# Patient Record
Sex: Female | Born: 1995 | Race: Black or African American | Hispanic: No | Marital: Single | Smoking: Never smoker
Health system: Southern US, Community
[De-identification: ages and names within clinical notes are randomized; demographics above are authoritative.]

---
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
## 2017-06-20 IMAGING — CR DG HAND 2V*R*
2 series · 2 of 2 positions shown · non-contrast
Comparison: Right index finger radiographs performed earlier today
at [DATE] p.m.

CLINICAL DATA: Status post reduction of right index finger
dislocation. Initial encounter.

EXAM:
RIGHT HAND - 2 VIEW

[x hand pa right]
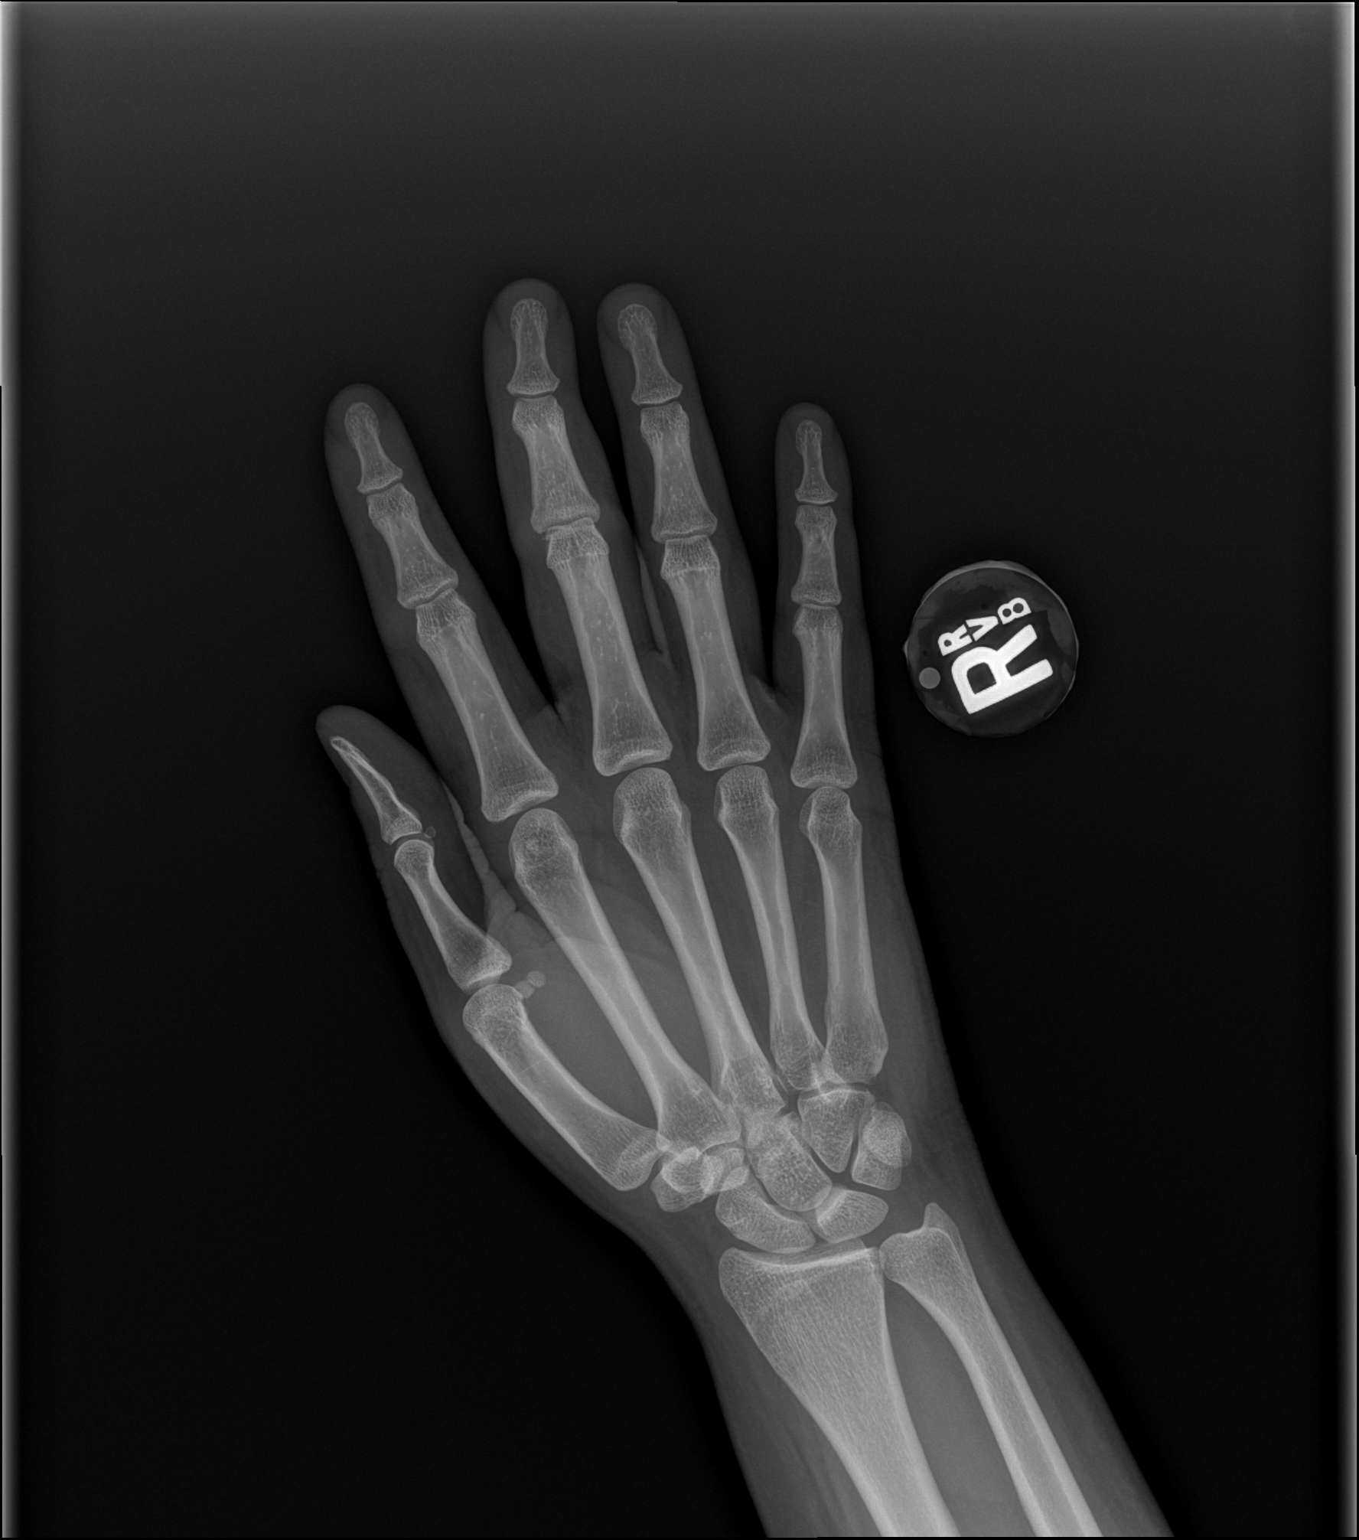

[x hand lat right]
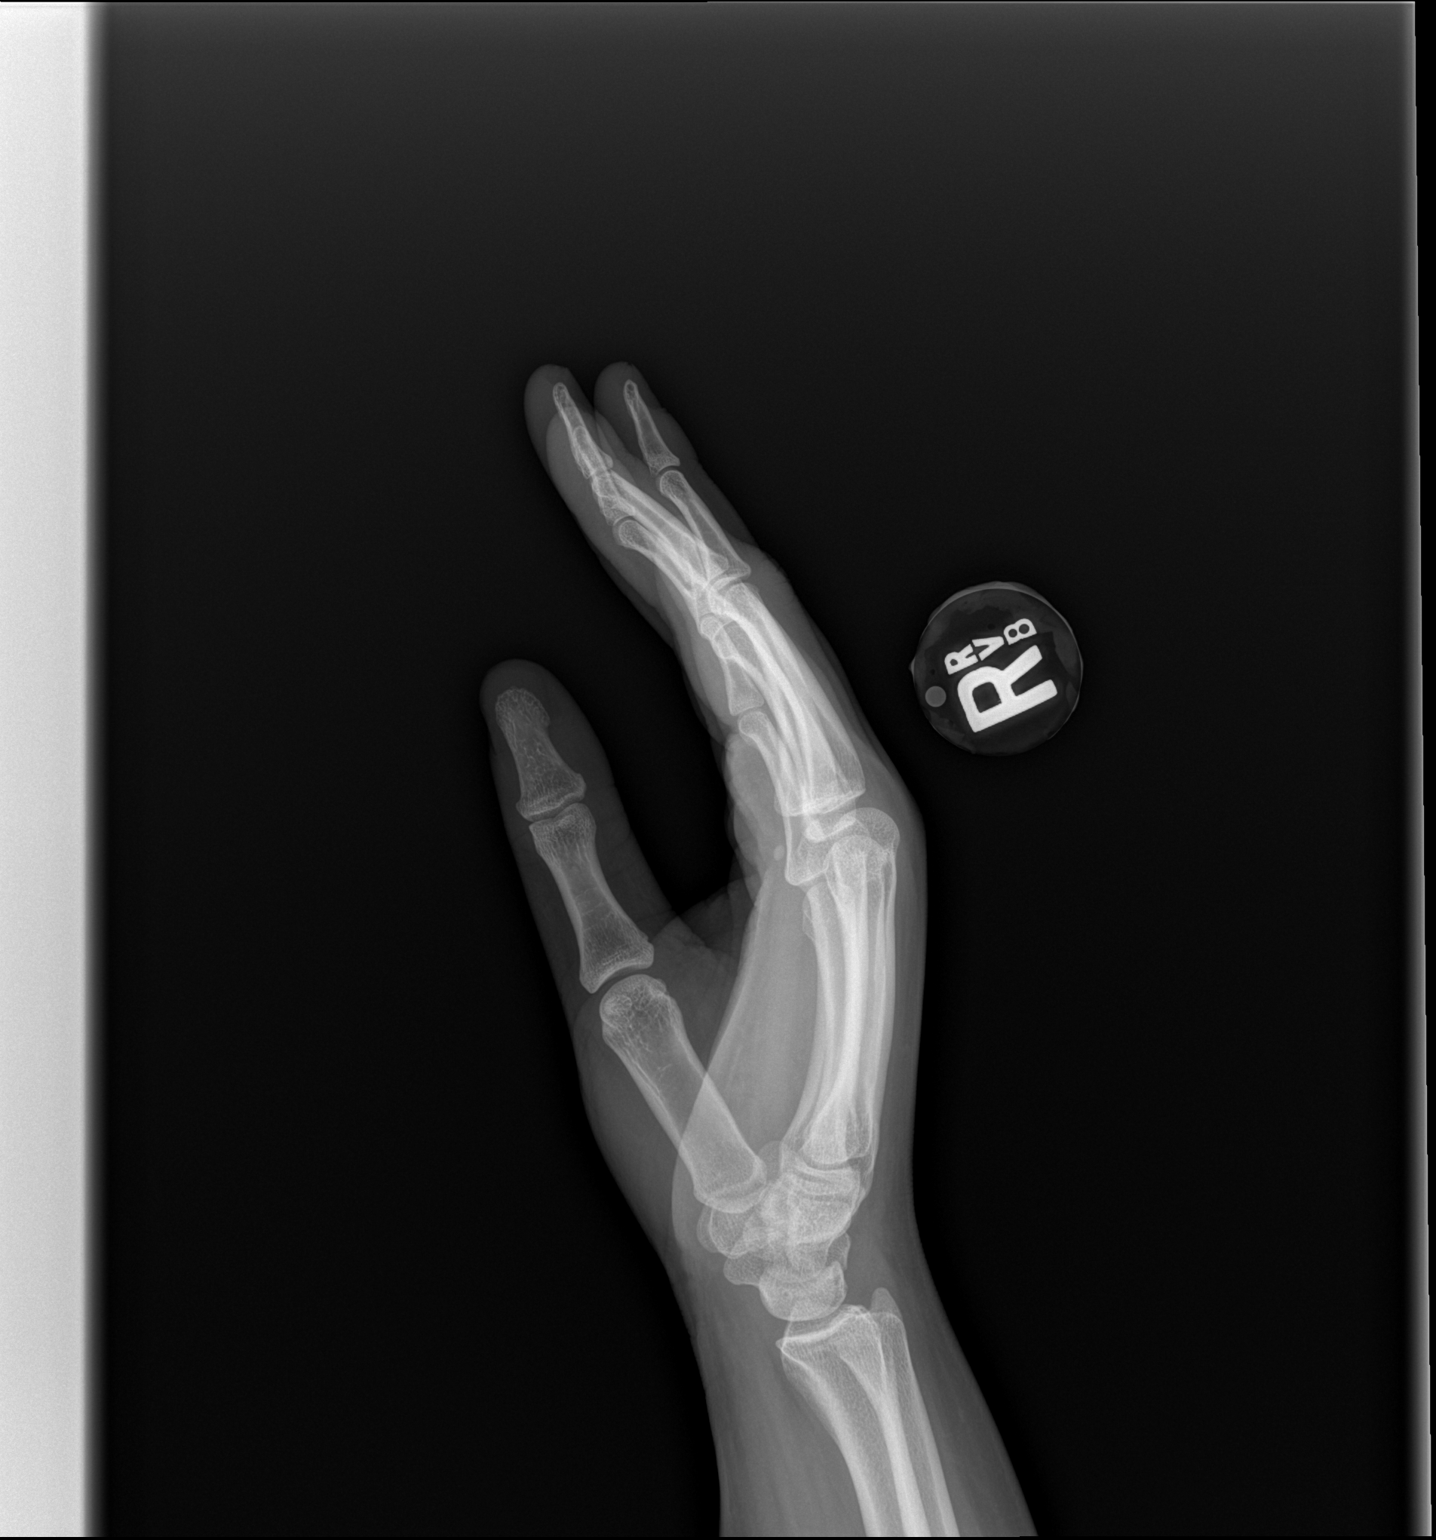

[2 of 2 positions shown; findings below may reference images not displayed]

FINDINGS: There has been successful reduction of the patient's second proximal
interphalangeal joint dislocation. No definite fracture is seen. The
carpal rows appear grossly intact, and demonstrate normal alignment.
IMPRESSION: Successful reduction of second proximal interphalangeal joint
dislocation. No fracture seen.

## 2017-06-20 IMAGING — CR DG FINGER INDEX 2+V*R*
3 series · 3 of 3 positions shown · non-contrast
Comparison: None.

CLINICAL DATA: Tripped and injured right index finger while running
up stairs. Initial encounter.

EXAM:
RIGHT INDEX FINGER 2+V

[x finger pa right]
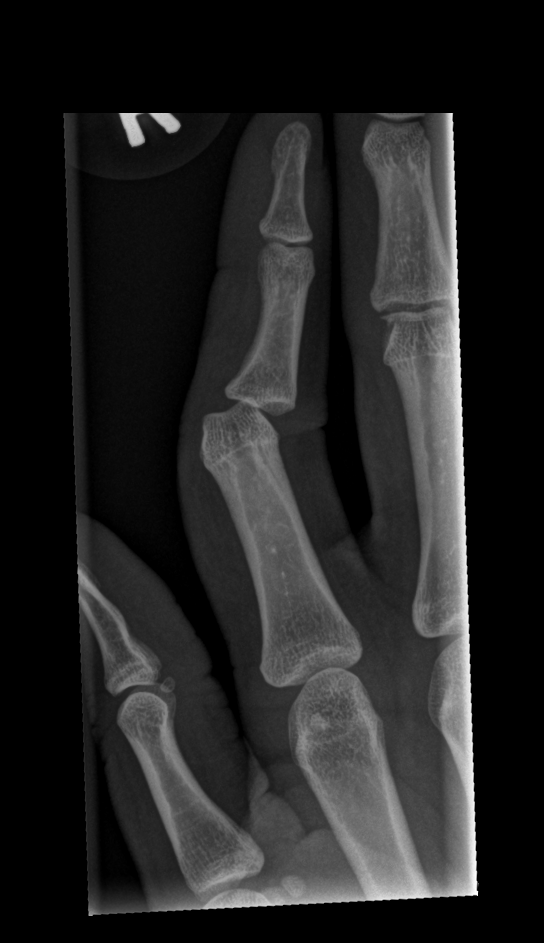

[x finger obl right]
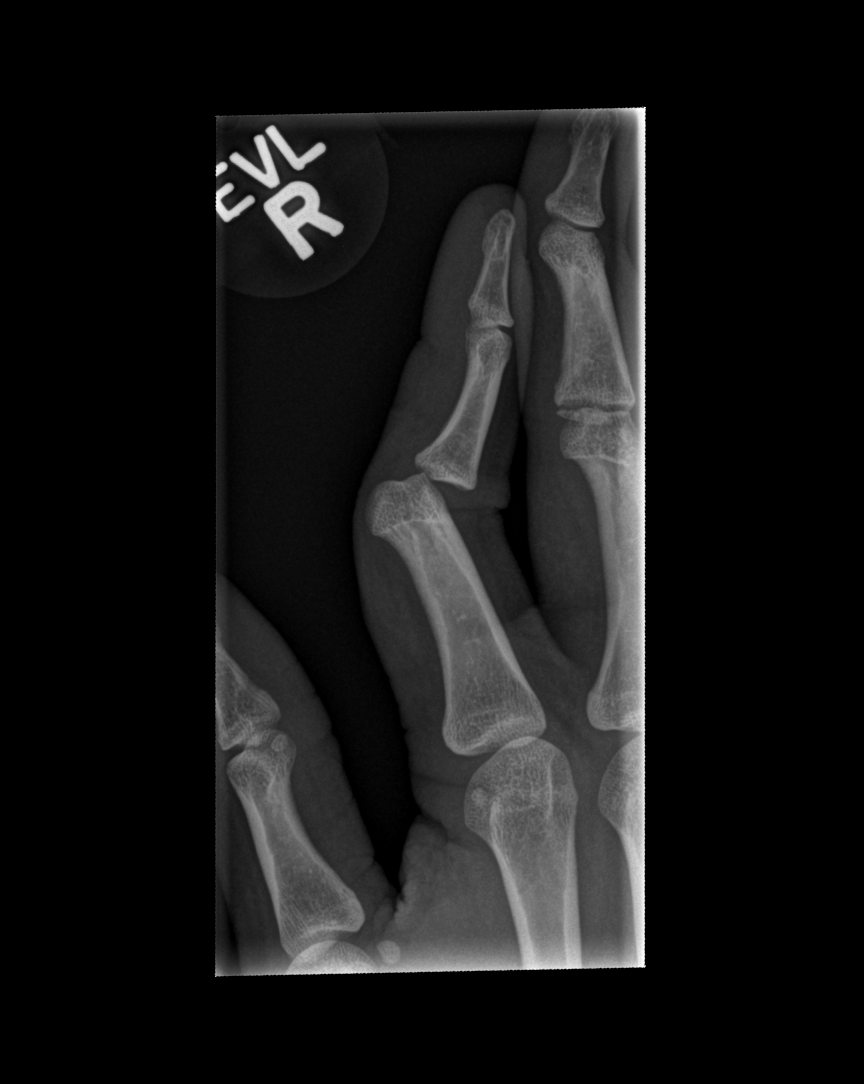

[x finger lat right]
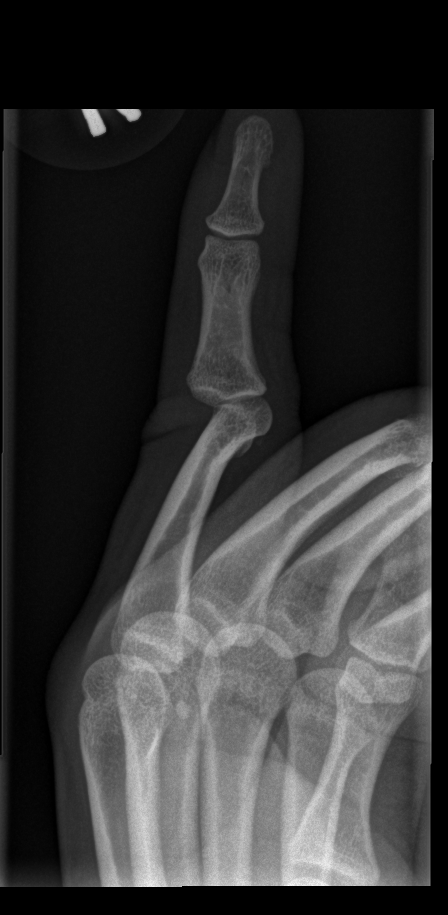

[3 of 3 positions shown; findings below may reference images not displayed]

FINDINGS: There is ulnar and dorsal dislocation of the second proximal
interphalangeal joint, with mild associated rotation. No definite
fracture is seen. Surrounding soft tissue swelling is noted.
IMPRESSION: Ulnar and dorsal dislocation of the second proximal interphalangeal
joint, with mild associated rotation. The base of the middle phalanx
is lodged against the dorsal aspect of the proximal phalanx.

## 2020-11-28 ENCOUNTER — Other Ambulatory Visit: Payer: Self-pay

## 2020-11-28 ENCOUNTER — Ambulatory Visit
Admission: RE | Admit: 2020-11-28 | Discharge: 2020-11-28 | Disposition: A | Discharge status: Home / Self Care | Payer: BC Managed Care – PPO | Source: Ambulatory Visit | Attending: Urgent Care | Admitting: Urgent Care

## 2020-11-28 VITALS — BP 129/89 | Temp 98.1°F | Resp 17

## 2020-11-28 DIAGNOSIS — R1030 Lower abdominal pain, unspecified: Secondary | ICD-10-CM

## 2020-11-28 DIAGNOSIS — Z7251 High risk heterosexual behavior: Secondary | ICD-10-CM

## 2020-11-28 DIAGNOSIS — N946 Dysmenorrhea, unspecified: Secondary | ICD-10-CM | POA: Insufficient documentation

## 2020-11-28 LAB — POCT URINALYSIS DIP (MANUAL ENTRY)
Bilirubin, UA: NEGATIVE
Blood, UA: NEGATIVE
Glucose, UA: NEGATIVE mg/dL
Nitrite, UA: NEGATIVE
Protein Ur, POC: NEGATIVE mg/dL
Spec Grav, UA: 1.025 (ref 1.010–1.025)
Urobilinogen, UA: 0.2 E.U./dL
pH, UA: 8.5 — AB (ref 5.0–8.0)

## 2020-11-28 LAB — POCT URINE PREGNANCY: Preg Test, Ur: NEGATIVE

## 2020-11-28 MED ORDER — NAPROXEN 500 MG PO TABS: 500.0000 mg | ORAL_TABLET | Freq: Two times a day (BID) | ORAL | 0 refills | Status: DC

## 2020-11-28 NOTE — Discharge Instructions (Signed)
Please establish care with a gynecologist at [facility] to get a consultation on the kinds of medications you could use to help with your dysmenorrhea, painful menstrual cycles.  In the meantime, you can use naproxen for pelvic pains and general menstrual cramp pain.  We will notify you about your test results as soon as they are available.

## 2020-11-28 NOTE — ED Provider Notes (Signed)
[facility]   MRN: [id number] DOB: 04-25-1996  Subjective:   Carol Reese is a 24 y.o. female presenting for 3 day history of lower abdominal pain, nausea without vomiting, vaginal discharge.  Patient is sexually active, has one female partner, does not use condoms for protection.  Is not on birth control.  Patient has a history of dysmenorrhea but does not take medications for that.  She would like recommendations for establishing care with a gynecologist.  Has never had a Pap smear.  No current facility-administered medications for this encounter. No current outpatient medications on file.   No Known Allergies  History reviewed. No pertinent past medical history.   History reviewed. No pertinent surgical history.  History reviewed. No pertinent family history.  Social History   Tobacco Use   Smoking status: Never Smoker   Smokeless tobacco: Never Used  Vaping Use   Vaping Use: Never used  Substance Use Topics   Alcohol use: No   Drug use: No    ROS   Objective:   Vitals: BP 129/89 (BP Location: Right Arm)    Temp 98.1 F (36.7 C) (Oral)    Resp 17    SpO2 99%   Physical Exam Constitutional:      General: She is not in acute distress.    Appearance: Normal appearance. She is well-developed. She is not ill-appearing, toxic-appearing or diaphoretic.  HENT:     Head: Normocephalic and atraumatic.     Right Ear: External ear normal.     Left Ear: External ear normal.     Nose: Nose normal.     Mouth/Throat:     Mouth: Mucous membranes are moist.     Pharynx: Oropharynx is clear.  Eyes:     General: No scleral icterus.       Right eye: No discharge.        Left eye: No discharge.     Extraocular Movements: Extraocular movements intact.     Pupils: Pupils are equal, round, and reactive to light.  Cardiovascular:     Rate and Rhythm: Normal rate.  Pulmonary:     Effort: Pulmonary effort is normal.  Abdominal:     General: Bowel  sounds are normal. There is no distension.     Palpations: Abdomen is soft. There is no mass.     Tenderness: There is no abdominal tenderness. There is no right CVA tenderness, left CVA tenderness, guarding or rebound.  Skin:    General: Skin is warm and dry.  Neurological:     General: No focal deficit present.     Mental Status: She is alert and oriented to person, place, and time.  Psychiatric:        Mood and Affect: Mood normal.        Behavior: Behavior normal.        Thought Content: Thought content normal.        Judgment: Judgment normal.     Results for orders placed or performed during the hospital encounter of 11/28/20 (from the past 24 hour(s))  POCT urinalysis dipstick     Status: Abnormal   Collection Time: 11/28/20 12:49 PM  Result Value Ref Range   Color, UA yellow yellow   Clarity, UA clear clear   Glucose, UA negative negative mg/dL   Bilirubin, UA negative negative   Ketones, POC UA trace (5) (A) negative mg/dL   Spec Grav, UA 2.952 8.413 - 1.025   Blood, UA negative negative  pH, UA 8.5 (A) 5.0 - 8.0   Protein Ur, POC negative negative mg/dL   Urobilinogen, UA 0.2 0.2 or 1.0 E.U./dL   Nitrite, UA Negative Negative   Leukocytes, UA Trace (A) Negative  POCT urine pregnancy     Status: None   Collection Time: 11/28/20 12:49 PM  Result Value Ref Range   Preg Test, Ur Negative Negative    Assessment and Plan :   PDMP not reviewed this encounter.  1. Lower abdominal pain   2. Dysmenorrhea   3. Unprotected sex     Will use naproxen for pain and inflammation, labs pending. Recommended establishing care with a gynecologist through [facility]. Counseled patient on potential for adverse effects with medications prescribed/recommended today, ER and return-to-clinic precautions discussed, patient verbalized understanding.    Wallis Bamberg, PA-C 11/28/20 1326

## 2020-11-28 NOTE — ED Triage Notes (Signed)
Patient states she has had abdominal pain mainly in the LLQ and RLQ since Wednesday. Pt also complains of nausea but no vomiting. Pain is on left side now but does move to the right side. Pt states heat makes the pain ease. Pt is aox4 and ambulatory.

## 2020-12-01 ENCOUNTER — Telehealth (HOSPITAL_COMMUNITY): Payer: Self-pay | Admitting: Emergency Medicine

## 2020-12-01 LAB — CERVICOVAGINAL ANCILLARY ONLY
Bacterial Vaginitis (gardnerella): POSITIVE — AB
Candida Glabrata: NEGATIVE
Candida Vaginitis: NEGATIVE
Chlamydia: POSITIVE — AB
Comment: NEGATIVE
Comment: NEGATIVE
Comment: NEGATIVE
Comment: NEGATIVE
Comment: NEGATIVE
Comment: NORMAL
Neisseria Gonorrhea: NEGATIVE
Trichomonas: POSITIVE — AB

## 2020-12-01 LAB — URINE CULTURE: Culture: <10000 — AB

## 2020-12-01 MED ORDER — METRONIDAZOLE 500 MG PO TABS
500.0000 mg | ORAL_TABLET | Freq: Two times a day (BID) | ORAL | 0 refills | Status: DC
Start: 2020-12-01 — End: 2023-06-09

## 2020-12-01 MED ORDER — DOXYCYCLINE HYCLATE 100 MG PO CAPS: 100.0000 mg | ORAL_CAPSULE | Freq: Two times a day (BID) | ORAL | 0 refills | Status: AC

## 2023-06-09 ENCOUNTER — Encounter (HOSPITAL_COMMUNITY): Payer: Self-pay

## 2023-06-09 ENCOUNTER — Ambulatory Visit (HOSPITAL_COMMUNITY)
Admission: EM | Admit: 2023-06-09 | Discharge: 2023-06-09 | Disposition: A | Discharge status: Home / Self Care | Payer: PRIVATE HEALTH INSURANCE | Attending: Family Medicine | Admitting: Family Medicine

## 2023-06-09 DIAGNOSIS — G510 Bell's palsy: Secondary | ICD-10-CM | POA: Diagnosis not present

## 2023-06-09 MED ORDER — PREDNISONE 20 MG PO TABS: ORAL_TABLET | ORAL | 0 refills | Status: AC

## 2023-06-09 MED ORDER — VALACYCLOVIR HCL 1 G PO TABS: 1000.0000 mg | ORAL_TABLET | Freq: Three times a day (TID) | ORAL | 0 refills | Status: AC

## 2023-06-09 NOTE — Discharge Instructions (Addendum)
Take valacyclovir 1000 mg--1 tablet 3 times daily for 7 days  Prednisone 20 mg--you are to take 3 tabs daily for 5 days then take 2 tabs daily once daily for 2 days then take 1 tablet daily for 2 days then take 1/2 tablet once for the next day and then stop.  You can use refresh or Systane artificial tears eyedrops.  Taping your eyelid shut on the left is a good idea when you are sleeping).

## 2023-06-09 NOTE — ED Triage Notes (Signed)
Patient here today with c/o left side of facial numbness and swelling since yesterday. Left eye watery and irritated. The left side of her lip feels pulled to the right side. No SOB. No fever. No chest pains. She has taken Tylenol Sinus with no relief.

## 2023-06-09 NOTE — ED Provider Notes (Signed)
[facility]    CSN: [id number] Arrival date & time: 06/09/23  1238      History   Chief Complaint Chief Complaint  Patient presents with   Facial Swelling    HPI Carol Reese is a 27 y.o. female.   HPI Here for left facial numbness that was first noted yesterday. Inside her left mouth feels numb and her left eye cannot close.   No f/c/n/v/d.  No cough or congestion.  History reviewed. No pertinent past medical history.  There are no problems to display for this patient.   History reviewed. No pertinent surgical history.  OB History   No obstetric history on file.      Home Medications    Prior to Admission medications   Medication Sig Start Date End Date Taking? Authorizing Provider  predniSONE (DELTASONE) 20 MG tablet 3 tabs daily for 5 days, then 2 tabs once daily x 2 days, then 1 tab  daily x 2 day, then 1/2 tab x 1 day, then stop 06/09/23  Yes Eila Runyan, Janace Aris, MD  valACYclovir (VALTREX) 1000 MG tablet Take 1 tablet (1,000 mg total) by mouth 3 (three) times daily for 7 days. 06/09/23 06/16/23 Yes Zenia Resides, MD    Family History History reviewed. No pertinent family history.  Social History Social History   Tobacco Use   Smoking status: Never   Smokeless tobacco: Never  Vaping Use   Vaping Use: Never used  Substance Use Topics   Alcohol use: No   Drug use: No     Allergies   Patient has no known allergies.   Review of Systems Review of Systems   Physical Exam Triage Vital Signs ED Triage Vitals [06/09/23 1313]  Enc Vitals Group     BP 116/72     Pulse Rate 83     Resp 16     Temp 98.4 F (36.9 C)     Temp Source Oral     SpO2 97 %     Weight 175 lb (79.4 kg)     Height 5\' 5"  (1.651 m)     Head Circumference      Peak Flow      Pain Score 0     Pain Loc      Pain Edu?      Excl. in GC?    No data found.  Updated Vital Signs BP 116/72 (BP Location: Left Arm)   Pulse 83   Temp 98.4 F (36.9  C) (Oral)   Resp 16   Ht 5\' 5"  (1.651 m)   Wt 79.4 kg   LMP 05/28/2023 (Exact Date)   SpO2 97%   BMI 29.12 kg/m   Visual Acuity Right Eye Distance:   Left Eye Distance:   Bilateral Distance:    Right Eye Near:   Left Eye Near:    Bilateral Near:     Physical Exam Vitals reviewed.  Constitutional:      General: She is not in acute distress.    Appearance: She is not ill-appearing, toxic-appearing or diaphoretic.  HENT:     Nose: Nose normal.     Mouth/Throat:     Mouth: Mucous membranes are moist.  Eyes:     Extraocular Movements: Extraocular movements intact.     Conjunctiva/sclera: Conjunctivae normal.     Pupils: Pupils are equal, round, and reactive to light.  Skin:    Coloration: Skin is not pale.  Neurological:     Mental Status:  She is alert and oriented to person, place, and time.     Comments: There is facial droop on the left.  Her left I cannot close against resistance.  She cannot wrinkle her forehead on the left.  Psychiatric:        Behavior: Behavior normal.      UC Treatments / Results  Labs (all labs ordered are listed, but only abnormal results are displayed) Labs Reviewed - No data to display  EKG   Radiology No results found.  Procedures Procedures (including critical care time)  Medications Ordered in UC Medications - No data to display  Initial Impression / Assessment and Plan / UC Course  I have reviewed the triage vital signs and the nursing notes.  Pertinent labs & imaging results that were available during my care of the patient were reviewed by me and considered in my medical decision making (see chart for details).        I discussed with her that her exam is consistent with Bell's palsy and not a stroke.  Burst of prednisone and Valtrex are sent in.  Education was given on Bell's palsy and what to expect. Final Clinical Impressions(s) / UC Diagnoses   Final diagnoses:  Bell's palsy     Discharge Instructions       Take valacyclovir 1000 mg--1 tablet 3 times daily for 7 days  Prednisone 20 mg--you are to take 3 tabs daily for 5 days then take 2 tabs daily once daily for 2 days then take 1 tablet daily for 2 days then take 1/2 tablet once for the next day and then stop.  You can use refresh or Systane artificial tears eyedrops.  Taping your eyelid shut on the left is a good idea when you are sleeping).     ED Prescriptions     Medication Sig Dispense Auth. Provider   valACYclovir (VALTREX) 1000 MG tablet Take 1 tablet (1,000 mg total) by mouth 3 (three) times daily for 7 days. 21 tablet Elleanna Melling, Janace Aris, MD   predniSONE (DELTASONE) 20 MG tablet 3 tabs daily for 5 days, then 2 tabs once daily x 2 days, then 1 tab  daily x 2 day, then 1/2 tab x 1 day, then stop 22 tablet Keandre Linden, Janace Aris, MD      PDMP not reviewed this encounter.   Zenia Resides, MD 06/09/23 [id number]

## 2024-03-22 ENCOUNTER — Other Ambulatory Visit (HOSPITAL_BASED_OUTPATIENT_CLINIC_OR_DEPARTMENT_OTHER): Payer: Self-pay

## 2024-03-22 ENCOUNTER — Ambulatory Visit
Admission: EM | Admit: 2024-03-22 | Discharge: 2024-03-22 | Disposition: A | Discharge status: Home / Self Care | Payer: Self-pay | Attending: Family Medicine | Admitting: Family Medicine

## 2024-03-22 DIAGNOSIS — L03213 Periorbital cellulitis: Secondary | ICD-10-CM

## 2024-03-22 MED ORDER — AMOXICILLIN-POT CLAVULANATE 875-125 MG PO TABS: 1.0000 | ORAL_TABLET | Freq: Two times a day (BID) | ORAL | 0 refills | Status: AC

## 2024-03-22 MED ORDER — TOBRAMYCIN 0.3 % OP SOLN: 1.0000 [drp] | OPHTHALMIC | 0 refills | Status: AC

## 2024-03-22 NOTE — ED Triage Notes (Signed)
 P tc/o left eye drainage, irritation and swelling x 2 days-states last night the "eye closed for like 30 sec and wouldn't open back up"-states she had same sx with additional sx June 2024-was given meds valtrex and prednisone but was told it was not bell's palsy-NAD-steady gait

## 2024-03-22 NOTE — ED Provider Notes (Signed)
 [facility]  Note:  This document was prepared using Conservation officer, historic buildings and may include unintentional dictation errors.  MRN: [id number] DOB: 05-May-1996  Subjective:   Carol Reese is a 28 y.o. female presenting for 2-day history of persistent left eye drainage, irritation, swelling and pain.  This past summer, she reports that she had a similar symptom set and was advised that she had Bell's palsy.  She was prescribed Valtrex and prednisone but does not feel that it made much of a difference.  Denies vision change, eye trauma, wounds.  No history of ophthalmologic conditions.  No sinus symptoms.  She is otherwise healthy to the best of her knowledge.  No chronic medications.  No Known Allergies  History reviewed. No pertinent past medical history.   History reviewed. No pertinent surgical history.  No family history on file.  Social History   Tobacco Use   Smoking status: Never   Smokeless tobacco: Never  Vaping Use   Vaping status: Never Used  Substance Use Topics   Alcohol use: Yes    Comment: weekly   Drug use: No    ROS   Objective:   Vitals: BP 126/76 (BP Location: Right Arm)   Pulse 87   Temp 98.2 F (36.8 C) (Oral)   Resp 20   LMP 03/11/2024 (Approximate)   SpO2 98%   Physical Exam Constitutional:      General: She is not in acute distress.    Appearance: Normal appearance. She is well-developed. She is not ill-appearing, toxic-appearing or diaphoretic.  HENT:     Head: Normocephalic and atraumatic.     Nose: Nose normal.     Mouth/Throat:     Mouth: Mucous membranes are moist.  Eyes:     General: Lids are everted, no foreign bodies appreciated. Vision grossly intact. No scleral icterus.       Right eye: No foreign body, discharge or hordeolum.        Left eye: No foreign body, discharge or hordeolum.     Extraocular Movements: Extraocular movements intact.     Right eye: Normal extraocular motion.      Left eye: Normal extraocular motion and no nystagmus.     Conjunctiva/sclera:     Right eye: Right conjunctiva is not injected. No chemosis, exudate or hemorrhage.    Left eye: Left conjunctiva is not injected. No chemosis, exudate or hemorrhage.  Cardiovascular:     Rate and Rhythm: Normal rate.  Pulmonary:     Effort: Pulmonary effort is normal.  Skin:    General: Skin is warm and dry.  Neurological:     General: No focal deficit present.     Mental Status: She is alert and oriented to person, place, and time.  Psychiatric:        Mood and Affect: Mood normal.        Behavior: Behavior normal.     Assessment and Plan :   PDMP not reviewed this encounter.  1. Preseptal cellulitis of left lower eyelid    Recommended manage for preseptal cellulitis of the left lower eyelid with Augmentin.  The conjunctive was slightly injected as well and therefore we will recommend tobramycin eyedrops.  Patient requests information for ophthalmologic practices.  Provided her with two and recommend close follow-up with them.  Counseled patient on potential for adverse effects with medications prescribed/recommended today, ER and return-to-clinic precautions discussed, patient verbalized understanding.    Wallis Bamberg, PA-C 03/22/24 1108
